# Patient Record
Sex: Female | Born: 1955 | Race: Black or African American | Hispanic: No | Marital: Married | Smoking: Never smoker
Health system: Southern US, Community
[De-identification: ages and names within clinical notes are randomized; demographics above are authoritative.]

## PROBLEM LIST (undated history)

## (undated) DIAGNOSIS — I10 Essential (primary) hypertension: Secondary | ICD-10-CM

---

## 1998-01-04 ENCOUNTER — Emergency Department (HOSPITAL_COMMUNITY): Admission: EM | Admit: 1998-01-04 | Discharge: 1998-01-04 | Payer: Self-pay | Admitting: Emergency Medicine

## 1998-07-07 ENCOUNTER — Other Ambulatory Visit: Admission: RE | Admit: 1998-07-07 | Discharge: 1998-07-07 | Payer: Self-pay | Admitting: Obstetrics and Gynecology

## 1999-07-13 ENCOUNTER — Other Ambulatory Visit: Admission: RE | Admit: 1999-07-13 | Discharge: 1999-07-13 | Payer: Self-pay | Admitting: *Deleted

## 2000-06-09 ENCOUNTER — Encounter: Payer: Self-pay | Admitting: Internal Medicine

## 2000-06-09 ENCOUNTER — Encounter: Admission: RE | Admit: 2000-06-09 | Discharge: 2000-06-09 | Payer: Self-pay | Admitting: Internal Medicine

## 2000-07-19 ENCOUNTER — Other Ambulatory Visit: Admission: RE | Admit: 2000-07-19 | Discharge: 2000-07-19 | Payer: Self-pay | Admitting: *Deleted

## 2001-06-19 ENCOUNTER — Encounter: Admission: RE | Admit: 2001-06-19 | Discharge: 2001-06-19 | Payer: Self-pay | Admitting: *Deleted

## 2001-06-19 ENCOUNTER — Encounter: Payer: Self-pay | Admitting: *Deleted

## 2001-09-14 ENCOUNTER — Other Ambulatory Visit: Admission: RE | Admit: 2001-09-14 | Discharge: 2001-09-14 | Payer: Self-pay | Admitting: Obstetrics and Gynecology

## 2001-09-14 ENCOUNTER — Encounter: Admission: RE | Admit: 2001-09-14 | Discharge: 2001-09-14 | Payer: Self-pay | Admitting: Obstetrics & Gynecology

## 2001-09-14 ENCOUNTER — Encounter: Payer: Self-pay | Admitting: Obstetrics & Gynecology

## 2002-06-25 ENCOUNTER — Encounter: Admission: RE | Admit: 2002-06-25 | Discharge: 2002-06-25 | Payer: Self-pay | Admitting: Internal Medicine

## 2002-06-25 ENCOUNTER — Encounter: Payer: Self-pay | Admitting: Internal Medicine

## 2002-10-31 ENCOUNTER — Other Ambulatory Visit: Admission: RE | Admit: 2002-10-31 | Discharge: 2002-10-31 | Payer: Self-pay | Admitting: Obstetrics and Gynecology

## 2003-07-17 ENCOUNTER — Encounter: Admission: RE | Admit: 2003-07-17 | Discharge: 2003-07-17 | Payer: Self-pay | Admitting: Internal Medicine

## 2004-08-03 ENCOUNTER — Encounter: Admission: RE | Admit: 2004-08-03 | Discharge: 2004-08-03 | Payer: Self-pay | Admitting: Internal Medicine

## 2005-08-30 ENCOUNTER — Encounter: Admission: RE | Admit: 2005-08-30 | Discharge: 2005-08-30 | Payer: Self-pay | Admitting: Internal Medicine

## 2006-09-05 ENCOUNTER — Encounter: Admission: RE | Admit: 2006-09-05 | Discharge: 2006-09-05 | Payer: Self-pay | Admitting: Internal Medicine

## 2007-09-11 ENCOUNTER — Encounter: Admission: RE | Admit: 2007-09-11 | Discharge: 2007-09-11 | Payer: Self-pay | Admitting: Internal Medicine

## 2008-09-12 ENCOUNTER — Encounter: Admission: RE | Admit: 2008-09-12 | Discharge: 2008-09-12 | Payer: Self-pay | Admitting: Internal Medicine

## 2009-09-14 ENCOUNTER — Encounter: Admission: RE | Admit: 2009-09-14 | Discharge: 2009-09-14 | Payer: Self-pay | Admitting: Internal Medicine

## 2010-09-06 ENCOUNTER — Other Ambulatory Visit: Payer: Self-pay | Admitting: Internal Medicine

## 2010-09-06 DIAGNOSIS — Z1231 Encounter for screening mammogram for malignant neoplasm of breast: Secondary | ICD-10-CM

## 2010-09-17 ENCOUNTER — Ambulatory Visit
Admission: RE | Admit: 2010-09-17 | Discharge: 2010-09-17 | Disposition: A | Discharge status: Home / Self Care | Payer: 59 | Source: Ambulatory Visit | Attending: Internal Medicine | Admitting: Internal Medicine

## 2010-09-17 DIAGNOSIS — Z1231 Encounter for screening mammogram for malignant neoplasm of breast: Secondary | ICD-10-CM

## 2011-10-04 ENCOUNTER — Other Ambulatory Visit: Payer: Self-pay | Admitting: Internal Medicine

## 2011-10-04 DIAGNOSIS — Z1231 Encounter for screening mammogram for malignant neoplasm of breast: Secondary | ICD-10-CM

## 2011-10-13 ENCOUNTER — Ambulatory Visit
Admission: RE | Admit: 2011-10-13 | Discharge: 2011-10-13 | Disposition: A | Discharge status: Home / Self Care | Payer: 59 | Source: Ambulatory Visit | Attending: Internal Medicine | Admitting: Internal Medicine

## 2011-10-13 DIAGNOSIS — Z1231 Encounter for screening mammogram for malignant neoplasm of breast: Secondary | ICD-10-CM

## 2012-10-03 ENCOUNTER — Other Ambulatory Visit: Payer: Self-pay

## 2012-10-03 DIAGNOSIS — Z1231 Encounter for screening mammogram for malignant neoplasm of breast: Secondary | ICD-10-CM

## 2012-10-26 ENCOUNTER — Ambulatory Visit
Admission: RE | Admit: 2012-10-26 | Discharge: 2012-10-26 | Disposition: A | Discharge status: Home / Self Care | Payer: 59 | Source: Ambulatory Visit

## 2012-10-26 DIAGNOSIS — Z1231 Encounter for screening mammogram for malignant neoplasm of breast: Secondary | ICD-10-CM

## 2013-09-29 ENCOUNTER — Emergency Department (HOSPITAL_COMMUNITY)
Admission: EM | Admit: 2013-09-29 | Discharge: 2013-09-29 | Disposition: A | Discharge status: Home / Self Care | Payer: No Typology Code available for payment source | Attending: Emergency Medicine | Admitting: Emergency Medicine

## 2013-09-29 ENCOUNTER — Encounter (HOSPITAL_COMMUNITY): Payer: Self-pay | Admitting: Emergency Medicine

## 2013-09-29 DIAGNOSIS — Y92410 Unspecified street and highway as the place of occurrence of the external cause: Secondary | ICD-10-CM | POA: Insufficient documentation

## 2013-09-29 DIAGNOSIS — S0990XA Unspecified injury of head, initial encounter: Secondary | ICD-10-CM | POA: Diagnosis present

## 2013-09-29 DIAGNOSIS — S139XXA Sprain of joints and ligaments of unspecified parts of neck, initial encounter: Secondary | ICD-10-CM | POA: Insufficient documentation

## 2013-09-29 DIAGNOSIS — I10 Essential (primary) hypertension: Secondary | ICD-10-CM | POA: Diagnosis not present

## 2013-09-29 DIAGNOSIS — Z79899 Other long term (current) drug therapy: Secondary | ICD-10-CM | POA: Insufficient documentation

## 2013-09-29 DIAGNOSIS — Y9389 Activity, other specified: Secondary | ICD-10-CM | POA: Diagnosis not present

## 2013-09-29 DIAGNOSIS — S161XXA Strain of muscle, fascia and tendon at neck level, initial encounter: Secondary | ICD-10-CM

## 2013-09-29 HISTORY — DX: Essential (primary) hypertension: I10

## 2013-09-29 LAB — URINALYSIS, ROUTINE W REFLEX MICROSCOPIC
Bilirubin Urine: NEGATIVE
Glucose, UA: NEGATIVE mg/dL
HGB URINE DIPSTICK: NEGATIVE
KETONES UR: NEGATIVE mg/dL
Nitrite: NEGATIVE
PH: 6.5 (ref 5.0–8.0)
PROTEIN: NEGATIVE mg/dL
SPECIFIC GRAVITY, URINE: 1.017 (ref 1.005–1.030)
UROBILINOGEN UA: 1 mg/dL (ref 0.0–1.0)

## 2013-09-29 LAB — URINE MICROSCOPIC-ADD ON

## 2013-09-29 LAB — I-STAT CHEM 8, ED
BUN: 23 mg/dL (ref 6–23)
CALCIUM ION: 1.17 mmol/L (ref 1.12–1.23)
CREATININE: 1 mg/dL (ref 0.50–1.10)
Chloride: 102 mEq/L (ref 96–112)
GLUCOSE: 99 mg/dL (ref 70–99)
HEMATOCRIT: 44 % (ref 36.0–46.0)
HEMOGLOBIN: 15 g/dL (ref 12.0–15.0)
POTASSIUM: 3.3 meq/L — AB (ref 3.7–5.3)
SODIUM: 143 meq/L (ref 137–147)
TCO2: 26 mmol/L (ref 0–100)

## 2013-09-29 NOTE — Discharge Instructions (Signed)
Return to the ED with any concerns including weakness of arms or legs, chest pain or abdominal pain, changes in vision or speech, difficulty breathing, decreased level of alertness/lethargy, or any other alarming symptoms
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
## 2013-09-29 NOTE — ED Notes (Signed)
MD at bedside. 
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
## 2013-09-29 NOTE — ED Notes (Signed)
Pt c/o headache 2/10 she took her BP meds and 2 Motrin at 1400 today.

## 2013-10-07 ENCOUNTER — Other Ambulatory Visit: Payer: Self-pay | Admitting: Internal Medicine

## 2013-10-07 ENCOUNTER — Ambulatory Visit
Admission: RE | Admit: 2013-10-07 | Discharge: 2013-10-07 | Disposition: A | Discharge status: Home / Self Care | Payer: 59 | Source: Ambulatory Visit | Attending: Internal Medicine | Admitting: Internal Medicine

## 2013-10-07 DIAGNOSIS — M25561 Pain in right knee: Secondary | ICD-10-CM

## 2013-10-07 DIAGNOSIS — M25531 Pain in right wrist: Secondary | ICD-10-CM

## 2013-11-13 ENCOUNTER — Other Ambulatory Visit: Payer: Self-pay

## 2013-11-13 DIAGNOSIS — Z1231 Encounter for screening mammogram for malignant neoplasm of breast: Secondary | ICD-10-CM

## 2013-11-27 ENCOUNTER — Ambulatory Visit
Admission: RE | Admit: 2013-11-27 | Discharge: 2013-11-27 | Disposition: A | Discharge status: Home / Self Care | Payer: 59 | Source: Ambulatory Visit

## 2013-11-27 DIAGNOSIS — Z1231 Encounter for screening mammogram for malignant neoplasm of breast: Secondary | ICD-10-CM

## 2014-11-17 ENCOUNTER — Other Ambulatory Visit: Payer: Self-pay

## 2014-11-17 DIAGNOSIS — Z1231 Encounter for screening mammogram for malignant neoplasm of breast: Secondary | ICD-10-CM

## 2014-12-11 ENCOUNTER — Ambulatory Visit
Admission: RE | Admit: 2014-12-11 | Discharge: 2014-12-11 | Disposition: A | Discharge status: Home / Self Care | Payer: 59 | Source: Ambulatory Visit

## 2014-12-11 DIAGNOSIS — Z1231 Encounter for screening mammogram for malignant neoplasm of breast: Secondary | ICD-10-CM

## 2015-12-08 ENCOUNTER — Other Ambulatory Visit: Payer: Self-pay | Admitting: Internal Medicine

## 2015-12-08 DIAGNOSIS — Z1231 Encounter for screening mammogram for malignant neoplasm of breast: Secondary | ICD-10-CM

## 2015-12-14 ENCOUNTER — Ambulatory Visit: Payer: 59

## 2015-12-15 ENCOUNTER — Ambulatory Visit
Admission: RE | Admit: 2015-12-15 | Discharge: 2015-12-15 | Disposition: A | Discharge status: Home / Self Care | Payer: 59 | Source: Ambulatory Visit | Attending: Internal Medicine | Admitting: Internal Medicine

## 2015-12-15 DIAGNOSIS — Z1231 Encounter for screening mammogram for malignant neoplasm of breast: Secondary | ICD-10-CM

## 2016-09-12 DIAGNOSIS — L811 Chloasma: Secondary | ICD-10-CM | POA: Diagnosis not present

## 2016-12-06 ENCOUNTER — Other Ambulatory Visit: Payer: Self-pay | Admitting: Internal Medicine

## 2016-12-06 DIAGNOSIS — Z1231 Encounter for screening mammogram for malignant neoplasm of breast: Secondary | ICD-10-CM

## 2016-12-15 ENCOUNTER — Encounter (INDEPENDENT_AMBULATORY_CARE_PROVIDER_SITE_OTHER): Payer: Self-pay

## 2016-12-15 ENCOUNTER — Ambulatory Visit
Admission: RE | Admit: 2016-12-15 | Discharge: 2016-12-15 | Disposition: A | Discharge status: Home / Self Care | Payer: 59 | Source: Ambulatory Visit | Attending: Internal Medicine | Admitting: Internal Medicine

## 2016-12-15 DIAGNOSIS — Z1231 Encounter for screening mammogram for malignant neoplasm of breast: Secondary | ICD-10-CM

## 2017-01-11 DIAGNOSIS — Z23 Encounter for immunization: Secondary | ICD-10-CM | POA: Diagnosis not present

## 2017-02-03 DIAGNOSIS — Z136 Encounter for screening for cardiovascular disorders: Secondary | ICD-10-CM | POA: Diagnosis not present

## 2017-02-03 DIAGNOSIS — Z0000 Encounter for general adult medical examination without abnormal findings: Secondary | ICD-10-CM | POA: Diagnosis not present

## 2017-02-21 DIAGNOSIS — Z01419 Encounter for gynecological examination (general) (routine) without abnormal findings: Secondary | ICD-10-CM | POA: Diagnosis not present

## 2017-06-08 DIAGNOSIS — Z23 Encounter for immunization: Secondary | ICD-10-CM | POA: Diagnosis not present

## 2017-08-08 DIAGNOSIS — I10 Essential (primary) hypertension: Secondary | ICD-10-CM | POA: Diagnosis not present

## 2017-08-08 DIAGNOSIS — M549 Dorsalgia, unspecified: Secondary | ICD-10-CM | POA: Diagnosis not present

## 2017-08-08 DIAGNOSIS — J309 Allergic rhinitis, unspecified: Secondary | ICD-10-CM | POA: Diagnosis not present

## 2017-11-28 ENCOUNTER — Other Ambulatory Visit: Payer: Self-pay | Admitting: Internal Medicine

## 2017-11-28 DIAGNOSIS — Z1231 Encounter for screening mammogram for malignant neoplasm of breast: Secondary | ICD-10-CM

## 2017-12-22 ENCOUNTER — Ambulatory Visit
Admission: RE | Admit: 2017-12-22 | Discharge: 2017-12-22 | Disposition: A | Discharge status: Home / Self Care | Payer: 59 | Source: Ambulatory Visit | Attending: Internal Medicine | Admitting: Internal Medicine

## 2017-12-22 DIAGNOSIS — Z1231 Encounter for screening mammogram for malignant neoplasm of breast: Secondary | ICD-10-CM | POA: Diagnosis not present

## 2018-02-26 DIAGNOSIS — Z6826 Body mass index (BMI) 26.0-26.9, adult: Secondary | ICD-10-CM | POA: Diagnosis not present

## 2018-02-26 DIAGNOSIS — Z01419 Encounter for gynecological examination (general) (routine) without abnormal findings: Secondary | ICD-10-CM | POA: Diagnosis not present

## 2018-03-29 DIAGNOSIS — Z13220 Encounter for screening for lipoid disorders: Secondary | ICD-10-CM | POA: Diagnosis not present

## 2018-03-29 DIAGNOSIS — Z1389 Encounter for screening for other disorder: Secondary | ICD-10-CM | POA: Diagnosis not present

## 2018-03-29 DIAGNOSIS — I10 Essential (primary) hypertension: Secondary | ICD-10-CM | POA: Diagnosis not present

## 2018-03-29 DIAGNOSIS — Z23 Encounter for immunization: Secondary | ICD-10-CM | POA: Diagnosis not present

## 2018-03-29 DIAGNOSIS — Z0000 Encounter for general adult medical examination without abnormal findings: Secondary | ICD-10-CM | POA: Diagnosis not present

## 2018-03-29 DIAGNOSIS — J309 Allergic rhinitis, unspecified: Secondary | ICD-10-CM | POA: Diagnosis not present

## 2018-03-29 DIAGNOSIS — M549 Dorsalgia, unspecified: Secondary | ICD-10-CM | POA: Diagnosis not present

## 2018-12-10 ENCOUNTER — Other Ambulatory Visit: Payer: Self-pay | Admitting: Internal Medicine

## 2018-12-10 DIAGNOSIS — Z1231 Encounter for screening mammogram for malignant neoplasm of breast: Secondary | ICD-10-CM

## 2019-01-24 ENCOUNTER — Other Ambulatory Visit: Payer: Self-pay

## 2019-01-24 ENCOUNTER — Ambulatory Visit
Admission: RE | Admit: 2019-01-24 | Discharge: 2019-01-24 | Disposition: A | Discharge status: Home / Self Care | Payer: 59 | Source: Ambulatory Visit | Attending: Internal Medicine | Admitting: Internal Medicine

## 2019-01-24 DIAGNOSIS — Z1231 Encounter for screening mammogram for malignant neoplasm of breast: Secondary | ICD-10-CM

## 2020-01-03 ENCOUNTER — Other Ambulatory Visit: Payer: Self-pay | Admitting: Internal Medicine

## 2020-01-03 DIAGNOSIS — Z1231 Encounter for screening mammogram for malignant neoplasm of breast: Secondary | ICD-10-CM

## 2020-01-30 ENCOUNTER — Other Ambulatory Visit: Payer: Self-pay

## 2020-01-30 ENCOUNTER — Ambulatory Visit
Admission: RE | Admit: 2020-01-30 | Discharge: 2020-01-30 | Disposition: A | Discharge status: Home / Self Care | Payer: 59 | Source: Ambulatory Visit | Attending: Internal Medicine | Admitting: Internal Medicine

## 2020-01-30 DIAGNOSIS — Z1231 Encounter for screening mammogram for malignant neoplasm of breast: Secondary | ICD-10-CM

## 2021-01-04 ENCOUNTER — Other Ambulatory Visit: Payer: Self-pay | Admitting: Internal Medicine

## 2021-01-04 DIAGNOSIS — Z1231 Encounter for screening mammogram for malignant neoplasm of breast: Secondary | ICD-10-CM

## 2021-02-01 ENCOUNTER — Other Ambulatory Visit: Payer: Self-pay

## 2021-02-01 ENCOUNTER — Ambulatory Visit
Admission: RE | Admit: 2021-02-01 | Discharge: 2021-02-01 | Disposition: A | Discharge status: Home / Self Care | Payer: 59 | Source: Ambulatory Visit | Attending: Internal Medicine | Admitting: Internal Medicine

## 2021-02-01 DIAGNOSIS — Z1231 Encounter for screening mammogram for malignant neoplasm of breast: Secondary | ICD-10-CM

## 2022-01-11 ENCOUNTER — Other Ambulatory Visit: Payer: Self-pay | Admitting: Internal Medicine

## 2022-01-11 DIAGNOSIS — Z1231 Encounter for screening mammogram for malignant neoplasm of breast: Secondary | ICD-10-CM

## 2022-03-03 ENCOUNTER — Ambulatory Visit
Admission: RE | Admit: 2022-03-03 | Discharge: 2022-03-03 | Disposition: A | Discharge status: Home / Self Care | Payer: 59 | Source: Ambulatory Visit | Attending: Internal Medicine | Admitting: Internal Medicine

## 2022-03-03 DIAGNOSIS — Z1231 Encounter for screening mammogram for malignant neoplasm of breast: Secondary | ICD-10-CM

## 2023-01-29 IMAGING — MG MM DIGITAL SCREENING BILAT W/ TOMO AND CAD
6 of 10 series · 6 of 30 positions shown · non-contrast
Comparison: Previous exam(s).

CLINICAL DATA: Screening.

EXAM:
DIGITAL SCREENING BILATERAL MAMMOGRAM WITH TOMOSYNTHESIS AND CAD
TECHNIQUE: Bilateral screening digital craniocaudal and mediolateral oblique
mammograms were obtained. Bilateral screening digital breast
tomosynthesis was performed. The images were evaluated with
computer-aided detection.

[R CC synth-2D (1 of 2)]
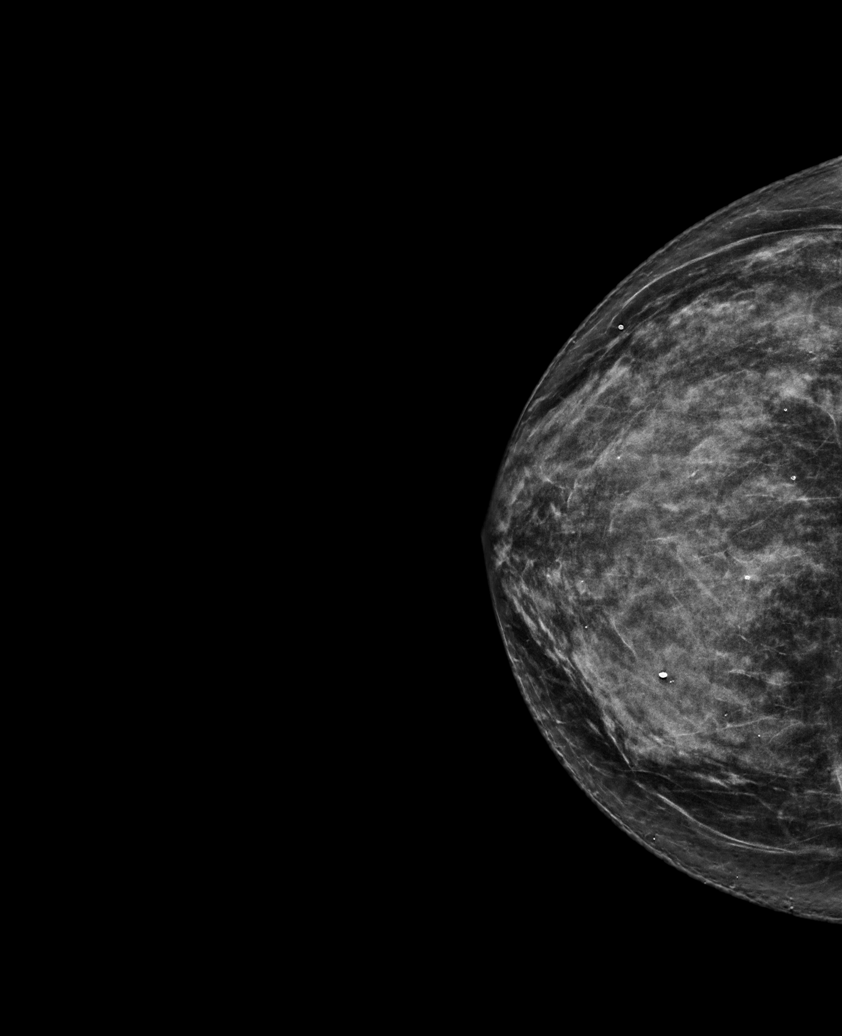

[L MLO synth-2D]
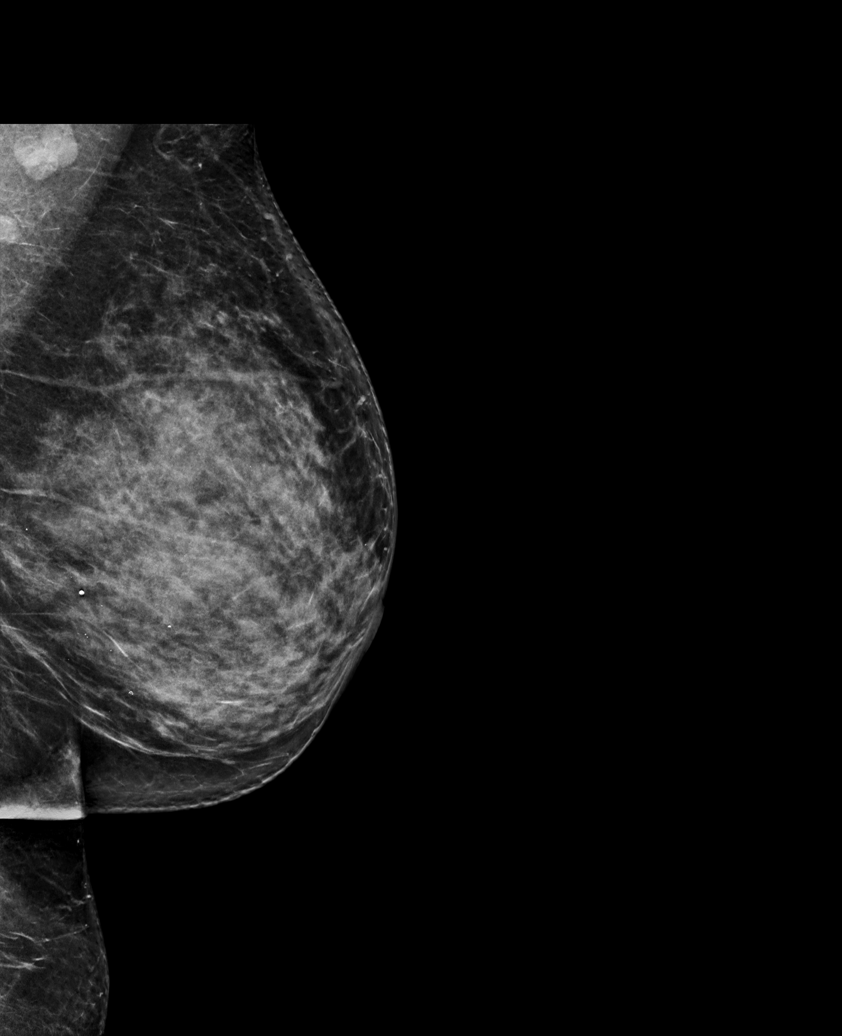

[R CC synth-2D (2 of 2)]
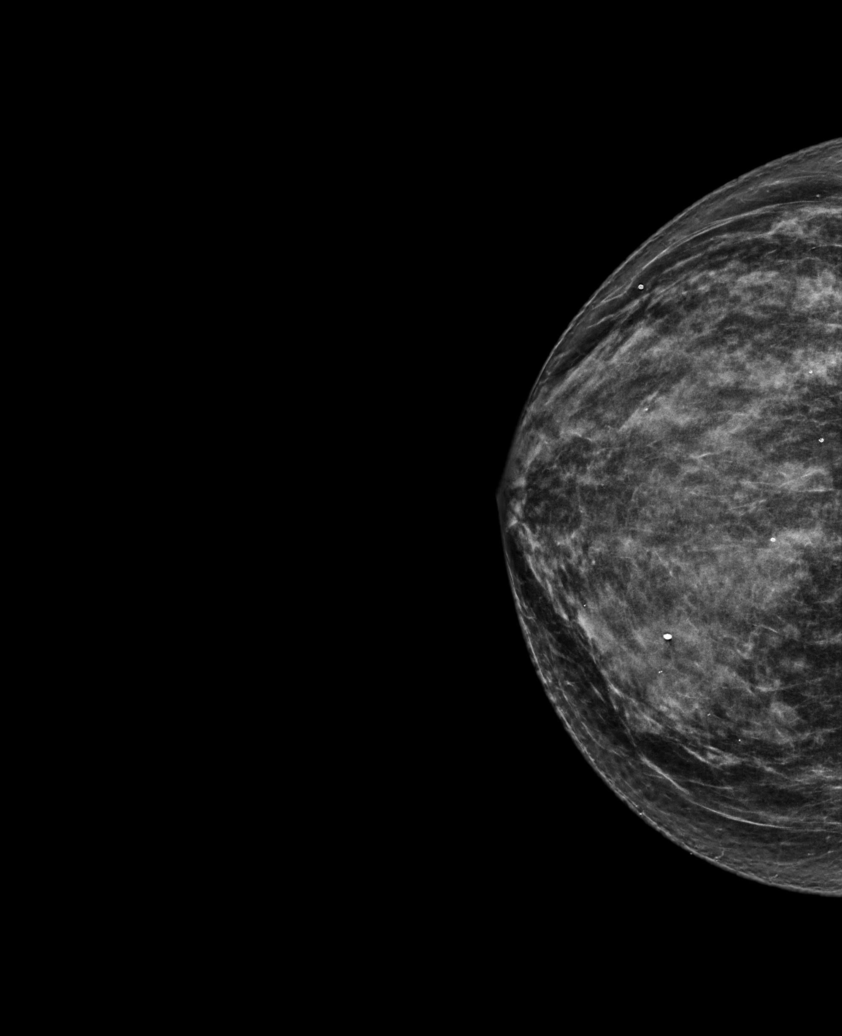

[R MLO synth-2D]
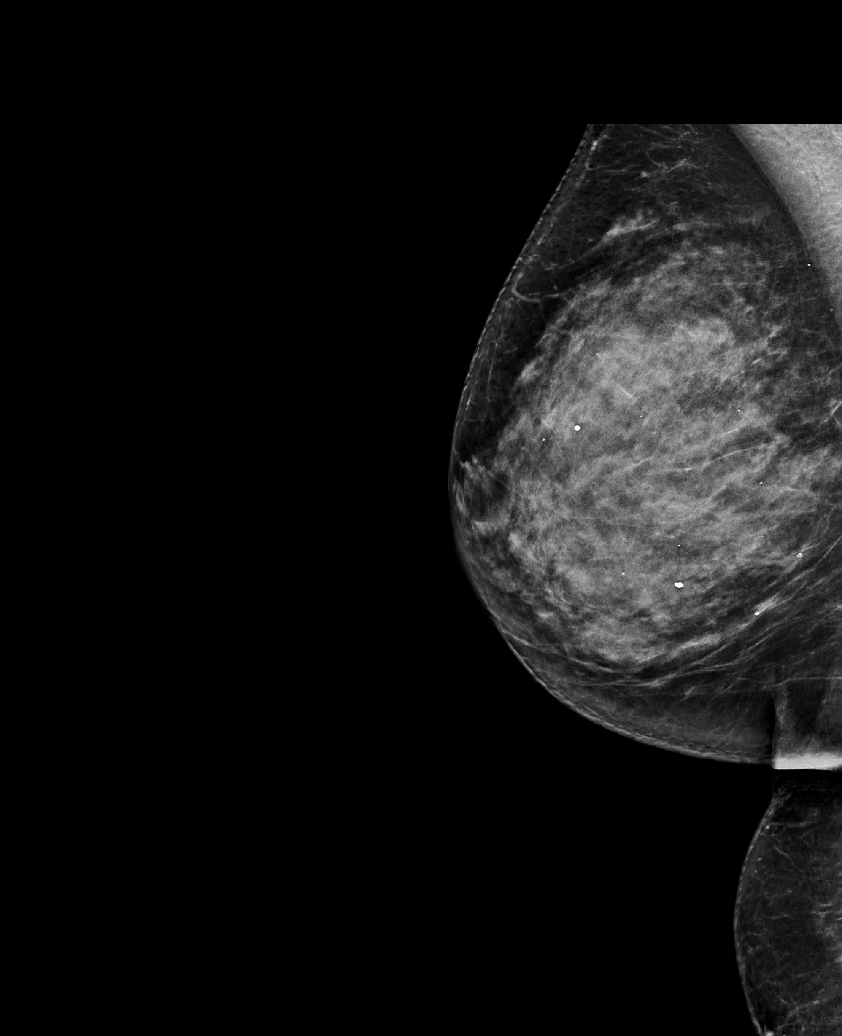

[L CC synth-2D]
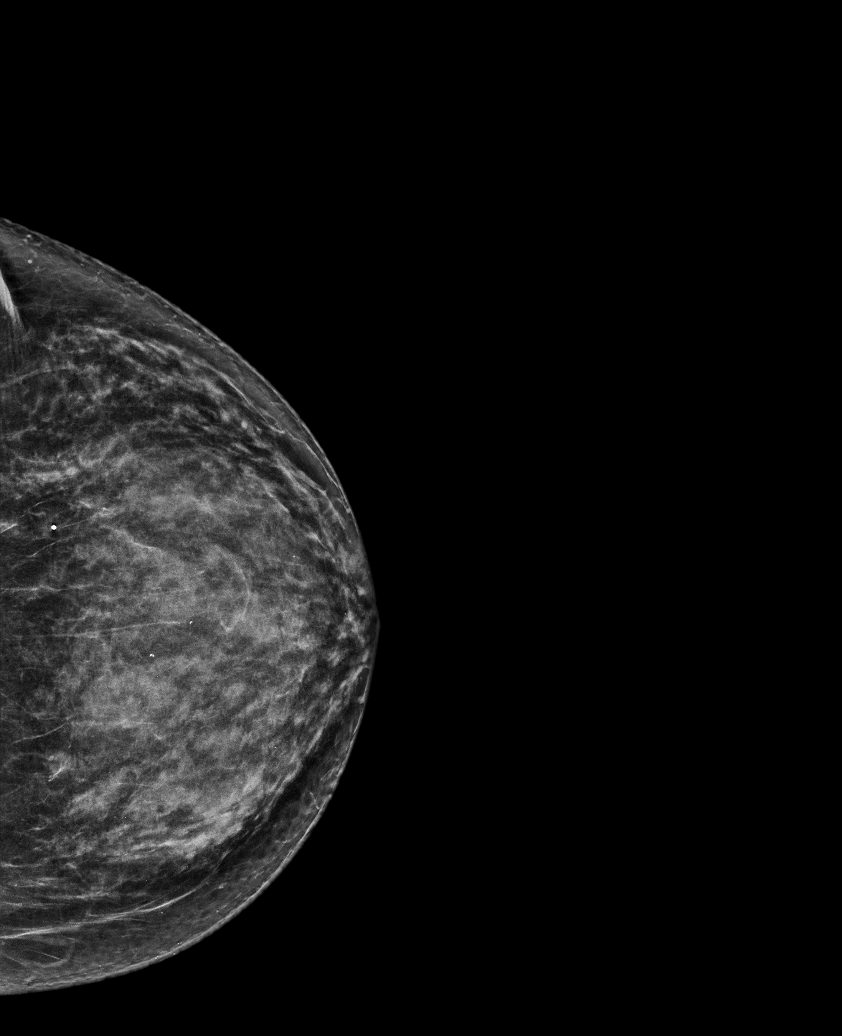

[R MLO tomo · tomo slice 39/78.0]
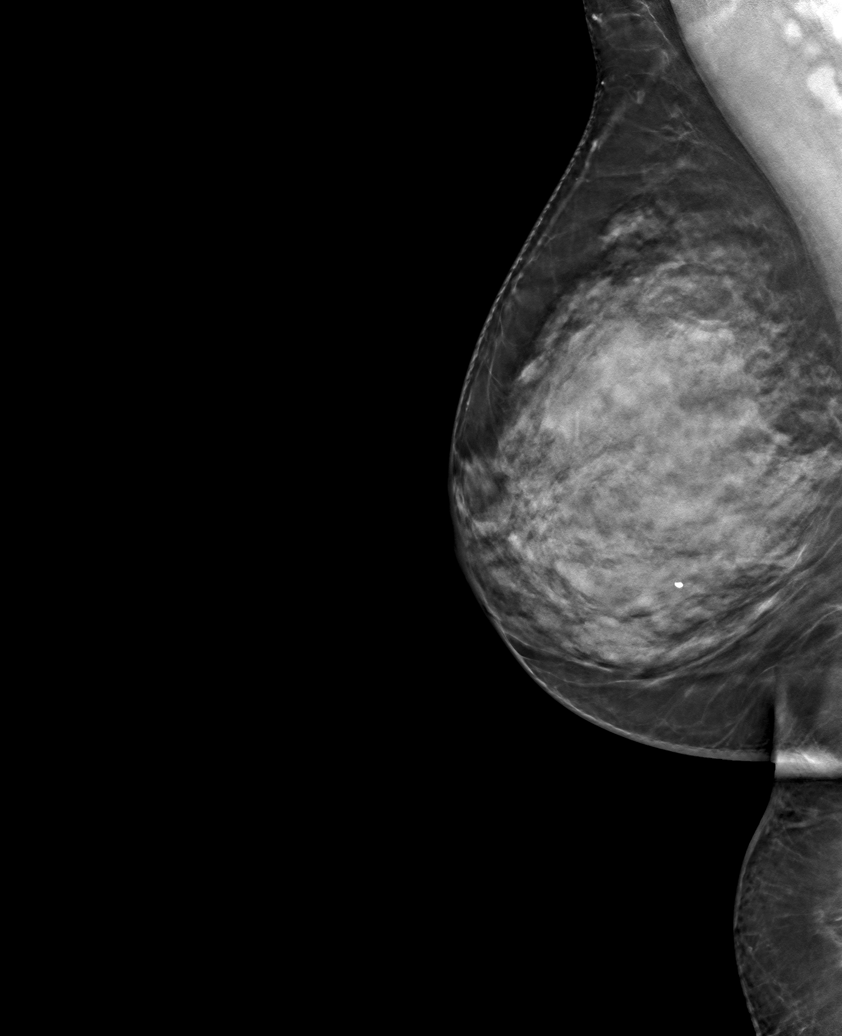

[6 of 30 positions shown; findings below may reference images not displayed]

ACR Breast Density Category d: The breast tissue is extremely dense,
which lowers the sensitivity of mammography
FINDINGS: There are no findings suspicious for malignancy.
IMPRESSION: No mammographic evidence of malignancy. A result letter of this
screening mammogram will be mailed directly to the patient.

RECOMMENDATION:
Screening mammogram in one year. (Code:TA-V-WV9)

BI-RADS CATEGORY  1: Negative.

## 2023-02-13 ENCOUNTER — Other Ambulatory Visit: Payer: Self-pay | Admitting: Internal Medicine

## 2023-02-13 DIAGNOSIS — Z1231 Encounter for screening mammogram for malignant neoplasm of breast: Secondary | ICD-10-CM

## 2023-03-09 ENCOUNTER — Ambulatory Visit
Admission: RE | Admit: 2023-03-09 | Discharge: 2023-03-09 | Disposition: A | Discharge status: Home / Self Care | Payer: 59 | Source: Ambulatory Visit | Attending: Internal Medicine | Admitting: Internal Medicine

## 2023-03-09 DIAGNOSIS — Z1231 Encounter for screening mammogram for malignant neoplasm of breast: Secondary | ICD-10-CM

## 2023-07-19 ENCOUNTER — Other Ambulatory Visit: Payer: Self-pay | Admitting: Internal Medicine

## 2023-07-19 DIAGNOSIS — E2839 Other primary ovarian failure: Secondary | ICD-10-CM

## 2024-02-21 ENCOUNTER — Other Ambulatory Visit: Payer: Self-pay | Admitting: Internal Medicine

## 2024-02-21 DIAGNOSIS — Z1231 Encounter for screening mammogram for malignant neoplasm of breast: Secondary | ICD-10-CM

## 2024-03-25 ENCOUNTER — Other Ambulatory Visit: Payer: Self-pay | Admitting: Internal Medicine

## 2024-03-25 DIAGNOSIS — I701 Atherosclerosis of renal artery: Secondary | ICD-10-CM

## 2024-03-26 ENCOUNTER — Ambulatory Visit
Admission: RE | Admit: 2024-03-26 | Discharge: 2024-03-26 | Disposition: A | Discharge status: Home / Self Care | Source: Ambulatory Visit | Attending: Internal Medicine | Admitting: Internal Medicine

## 2024-03-26 DIAGNOSIS — Z1231 Encounter for screening mammogram for malignant neoplasm of breast: Secondary | ICD-10-CM

## 2024-04-02 ENCOUNTER — Ambulatory Visit (HOSPITAL_BASED_OUTPATIENT_CLINIC_OR_DEPARTMENT_OTHER)
Admission: RE | Admit: 2024-04-02 | Discharge: 2024-04-02 | Disposition: A | Discharge status: Home / Self Care | Source: Ambulatory Visit | Attending: Internal Medicine | Admitting: Internal Medicine

## 2024-04-02 DIAGNOSIS — E2839 Other primary ovarian failure: Secondary | ICD-10-CM | POA: Insufficient documentation

## 2024-04-03 ENCOUNTER — Other Ambulatory Visit

## 2024-04-09 ENCOUNTER — Ambulatory Visit
Admission: RE | Admit: 2024-04-09 | Discharge: 2024-04-09 | Disposition: A | Source: Ambulatory Visit | Attending: Internal Medicine | Admitting: Internal Medicine

## 2024-04-09 DIAGNOSIS — I701 Atherosclerosis of renal artery: Secondary | ICD-10-CM

## 2024-04-09 MED ORDER — IOPAMIDOL (ISOVUE-370) INJECTION 76%
80.0000 mL | Freq: Once | INTRAVENOUS | Status: AC | PRN
  Administered 2024-04-09: 80 mL via INTRAVENOUS
